# Patient Record
Sex: Male | Born: 1990 | Hispanic: Yes | Marital: Single | Smoking: Current every day smoker
Health system: Southern US, Community
[De-identification: ages and names within clinical notes are randomized; demographics above are authoritative.]

---

## 2019-09-08 ENCOUNTER — Other Ambulatory Visit: Payer: Self-pay

## 2019-09-08 ENCOUNTER — Ambulatory Visit (HOSPITAL_COMMUNITY)
Admission: EM | Admit: 2019-09-08 | Discharge: 2019-09-08 | Disposition: A | Discharge status: Home / Self Care | Payer: Self-pay | Attending: Internal Medicine | Admitting: Internal Medicine

## 2020-06-13 ENCOUNTER — Encounter (HOSPITAL_COMMUNITY): Payer: Self-pay | Admitting: Emergency Medicine

## 2020-06-13 ENCOUNTER — Other Ambulatory Visit: Payer: Self-pay

## 2020-06-13 ENCOUNTER — Emergency Department (HOSPITAL_COMMUNITY)
Admission: EM | Admit: 2020-06-13 | Discharge: 2020-06-13 | Disposition: A | Discharge status: Home / Self Care | Payer: Self-pay | Attending: Emergency Medicine | Admitting: Emergency Medicine

## 2020-06-13 ENCOUNTER — Emergency Department (HOSPITAL_COMMUNITY): Payer: Self-pay

## 2020-06-13 DIAGNOSIS — R609 Edema, unspecified: Secondary | ICD-10-CM

## 2020-06-13 DIAGNOSIS — M7989 Other specified soft tissue disorders: Secondary | ICD-10-CM | POA: Insufficient documentation

## 2020-06-13 DIAGNOSIS — W000XXA Fall on same level due to ice and snow, initial encounter: Secondary | ICD-10-CM | POA: Insufficient documentation

## 2020-06-13 DIAGNOSIS — Z5321 Procedure and treatment not carried out due to patient leaving prior to being seen by health care provider: Secondary | ICD-10-CM | POA: Insufficient documentation

## 2020-06-13 DIAGNOSIS — M79672 Pain in left foot: Secondary | ICD-10-CM | POA: Insufficient documentation
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
## 2020-06-13 NOTE — ED Notes (Signed)
Pt called 3x no answer  

## 2020-12-04 ENCOUNTER — Other Ambulatory Visit: Payer: Self-pay

## 2020-12-04 ENCOUNTER — Ambulatory Visit (HOSPITAL_COMMUNITY)
Admission: EM | Admit: 2020-12-04 | Discharge: 2020-12-04 | Disposition: A | Discharge status: Home / Self Care | Payer: Self-pay | Attending: Emergency Medicine | Admitting: Emergency Medicine

## 2020-12-04 ENCOUNTER — Encounter (HOSPITAL_COMMUNITY): Payer: Self-pay

## 2020-12-04 DIAGNOSIS — R369 Urethral discharge, unspecified: Secondary | ICD-10-CM | POA: Insufficient documentation

## 2020-12-04 DIAGNOSIS — Z113 Encounter for screening for infections with a predominantly sexual mode of transmission: Secondary | ICD-10-CM | POA: Insufficient documentation

## 2020-12-04 NOTE — ED Triage Notes (Signed)
Pt presents with penile discharge for past few days.

## 2020-12-04 NOTE — Discharge Instructions (Addendum)
We will contact you with the results from your lab work and any additional treatment.    Do not have sex while taking undergoing treatment for STI.  Make sure that all of your partners get tested and treated.   Use a condom or other barrier method for all sexual encounters.    Return or go to the Emergency Department if symptoms worsen or do not improve in the next few days.   

## 2020-12-04 NOTE — ED Provider Notes (Signed)
[facility]    CSN: [id number] Arrival date & time: 12/04/20  1204      History   Chief Complaint Chief Complaint  Patient presents with   SEXUALLY TRANSMITTED DISEASE    HPI Vincent Thomas is a 30 y.o. male.   Patient here for evaluation of penile discharge that has been ongoing for the past few days.  Reports similar to when he had an STI in the past.  Denies any known contact with STI positive partners.  Denies any dysuria, urgency, frequency, or flank pain.  Denies any trauma, injury, or other precipitating event.  Denies any specific alleviating or aggravating factors.  Denies any fevers, chest pain, shortness of breath, N/V/D, numbness, tingling, weakness, abdominal pain, or headaches.     The history is provided by the patient.   History reviewed. No pertinent past medical history.  There are no problems to display for this patient.   History reviewed. No pertinent surgical history.     Home Medications    Prior to Admission medications   Not on File    Family History Family History  Family history unknown: Yes    Social History Social History   Tobacco Use   Smoking status: Every Day    Pack years: 0.00  Substance Use Topics   Alcohol use: Yes     Allergies   Patient has no known allergies.   Review of Systems Review of Systems  Genitourinary:  Positive for penile discharge.  All other systems reviewed and are negative.   Physical Exam Triage Vital Signs ED Triage Vitals  Enc Vitals Group     BP 12/04/20 1400 119/67     Pulse Rate 12/04/20 1400 67     Resp 12/04/20 1400 18     Temp 12/04/20 1400 98.2 F (36.8 C)     Temp Source 12/04/20 1400 Oral     SpO2 12/04/20 1400 100 %     Weight --      Height --      Head Circumference --      Peak Flow --      Pain Score 12/04/20 1359 0     Pain Loc --      Pain Edu? --      Excl. in [place]? --    No data found.  Updated Vital Signs BP 119/67 (BP Location: Right Arm)    Pulse 67   Temp 98.2 F (36.8 C) (Oral)   Resp 18   SpO2 100%   Visual Acuity Right Eye Distance:   Left Eye Distance:   Bilateral Distance:    Right Eye Near:   Left Eye Near:    Bilateral Near:     Physical Exam Vitals and nursing note reviewed.  Constitutional:      General: He is not in acute distress.    Appearance: Normal appearance. He is not ill-appearing, toxic-appearing or diaphoretic.  HENT:     Head: Normocephalic and atraumatic.  Eyes:     Conjunctiva/sclera: Conjunctivae normal.  Cardiovascular:     Rate and Rhythm: Normal rate.     Pulses: Normal pulses.  Pulmonary:     Effort: Pulmonary effort is normal.  Abdominal:     General: Abdomen is flat.  Genitourinary:    Comments: declines Musculoskeletal:        General: Normal range of motion.     Cervical back: Normal range of motion.  Skin:    General: Skin is warm and  dry.  Neurological:     General: No focal deficit present.     Mental Status: He is alert and oriented to person, place, and time.  Psychiatric:        Mood and Affect: Mood normal.     UC Treatments / Results  Labs (all labs ordered are listed, but only abnormal results are displayed) Labs Reviewed  CYTOLOGY, (ORAL, ANAL, URETHRAL) ANCILLARY ONLY    EKG   Radiology No results found.  Procedures Procedures (including critical care time)  Medications Ordered in UC Medications - No data to display  Initial Impression / Assessment and Plan / UC Course  I have reviewed the triage vital signs and the nursing notes.  Pertinent labs & imaging results that were available during my care of the patient were reviewed by me and considered in my medical decision making (see chart for details).    Assessment negative for red flags or concerns.  Self swab obtained and will treat based on results.  Discussed that patient needs to abstain from sexual contacts while awaiting results.  Discussed safe sex practices including condom or  other barrier method use.  Follow-up with primary care as needed. Final Clinical Impressions(s) / UC Diagnoses   Final diagnoses:  Penile discharge  Screen for STD (sexually transmitted disease)     Discharge Instructions      We will contact you with the results from your lab work and any additional treatment.    Do not have sex while taking undergoing treatment for STI.  Make sure that all of your partners get tested and treated.   Use a condom or other barrier method for all sexual encounters.    Return or go to the Emergency Department if symptoms worsen or do not improve in the next few days.      ED Prescriptions   None    PDMP not reviewed this encounter.   Ivette Loyal, NP 12/04/20 1439

## 2020-12-05 LAB — CYTOLOGY, (ORAL, ANAL, URETHRAL) ANCILLARY ONLY
Chlamydia: NEGATIVE
Comment: NEGATIVE
Comment: NEGATIVE
Comment: NORMAL
Neisseria Gonorrhea: POSITIVE — AB
Trichomonas: NEGATIVE

## 2020-12-06 ENCOUNTER — Telehealth (HOSPITAL_COMMUNITY): Payer: Self-pay | Admitting: Emergency Medicine

## 2020-12-06 NOTE — Telephone Encounter (Signed)
Per protocol, patient will need treatment with IM Rocephin 500mg for positive Gonorrhea.   Attempted to reach patient x 1, [facility] notified 

## 2020-12-07 ENCOUNTER — Ambulatory Visit (HOSPITAL_COMMUNITY)
Admission: EM | Admit: 2020-12-07 | Discharge: 2020-12-07 | Disposition: A | Discharge status: Home / Self Care | Payer: Self-pay | Attending: Family Medicine | Admitting: Family Medicine

## 2020-12-07 DIAGNOSIS — A549 Gonococcal infection, unspecified: Secondary | ICD-10-CM

## 2020-12-07 MED ORDER — CEFTRIAXONE SODIUM 500 MG IJ SOLR
500.0000 mg | Freq: Once | INTRAMUSCULAR | Status: AC
  Administered 2020-12-07: 500 mg via INTRAMUSCULAR

## 2020-12-07 MED ORDER — CEFTRIAXONE SODIUM 500 MG IJ SOLR
INTRAMUSCULAR | Status: AC
  Filled 2020-12-07: qty 500

## 2020-12-07 MED ORDER — LIDOCAINE HCL (PF) 1 % IJ SOLN
INTRAMUSCULAR | Status: AC
  Filled 2020-12-07: qty 2

## 2020-12-07 NOTE — ED Triage Notes (Signed)
Patient in for treatment for positive Gonorrhea testing.

## 2020-12-07 NOTE — Discharge Instructions (Signed)
Abstain from sexual activity for 7 days. Follow up if symptoms continue.

## 2021-10-08 IMAGING — DX DG FOOT COMPLETE 3+V*L*
3 series · 3 of 3 positions shown · non-contrast
Comparison: None.

CLINICAL DATA: Status post fall.

EXAM:
LEFT FOOT - COMPLETE 3+ VIEW

[foot ap]
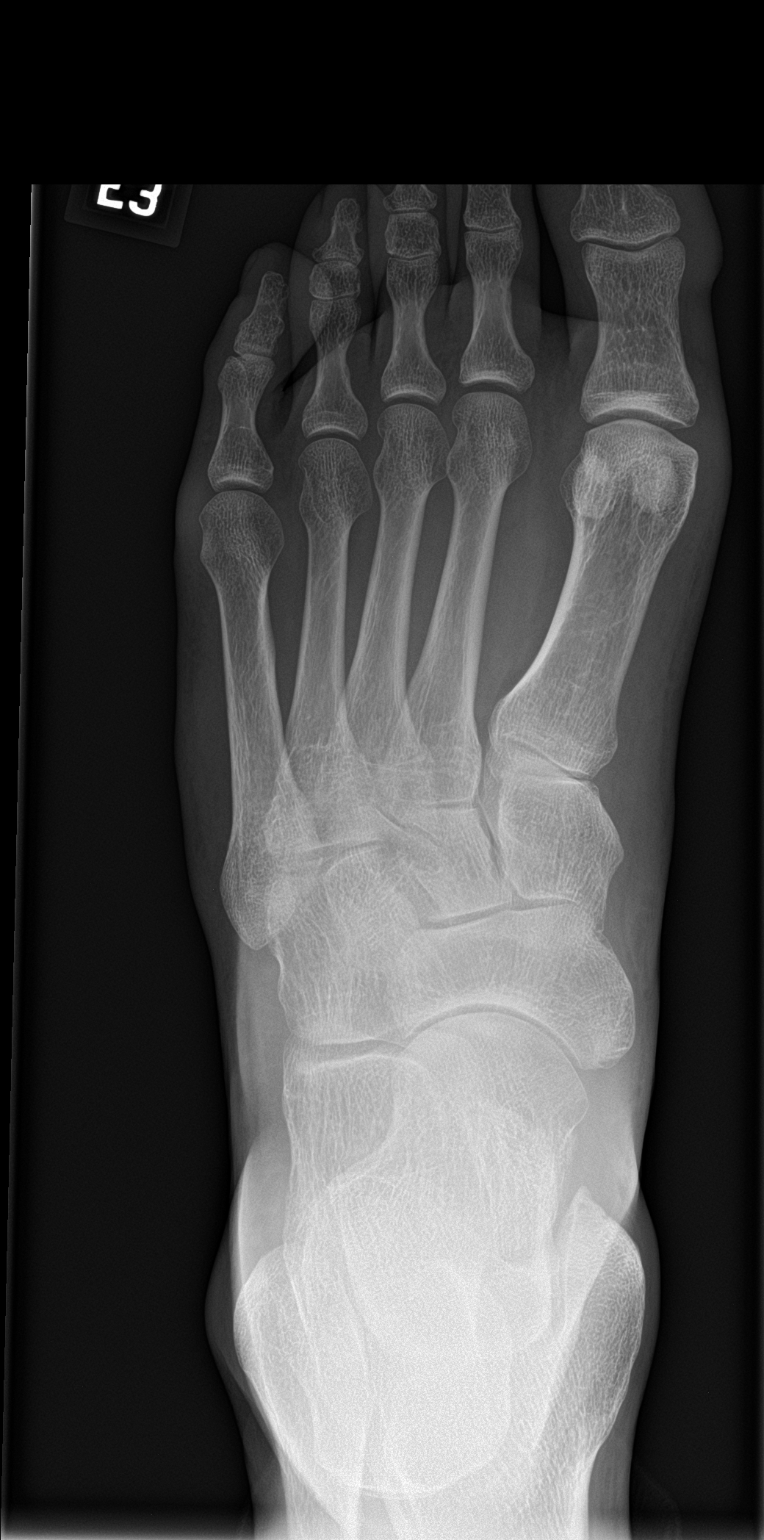

[foot obl]
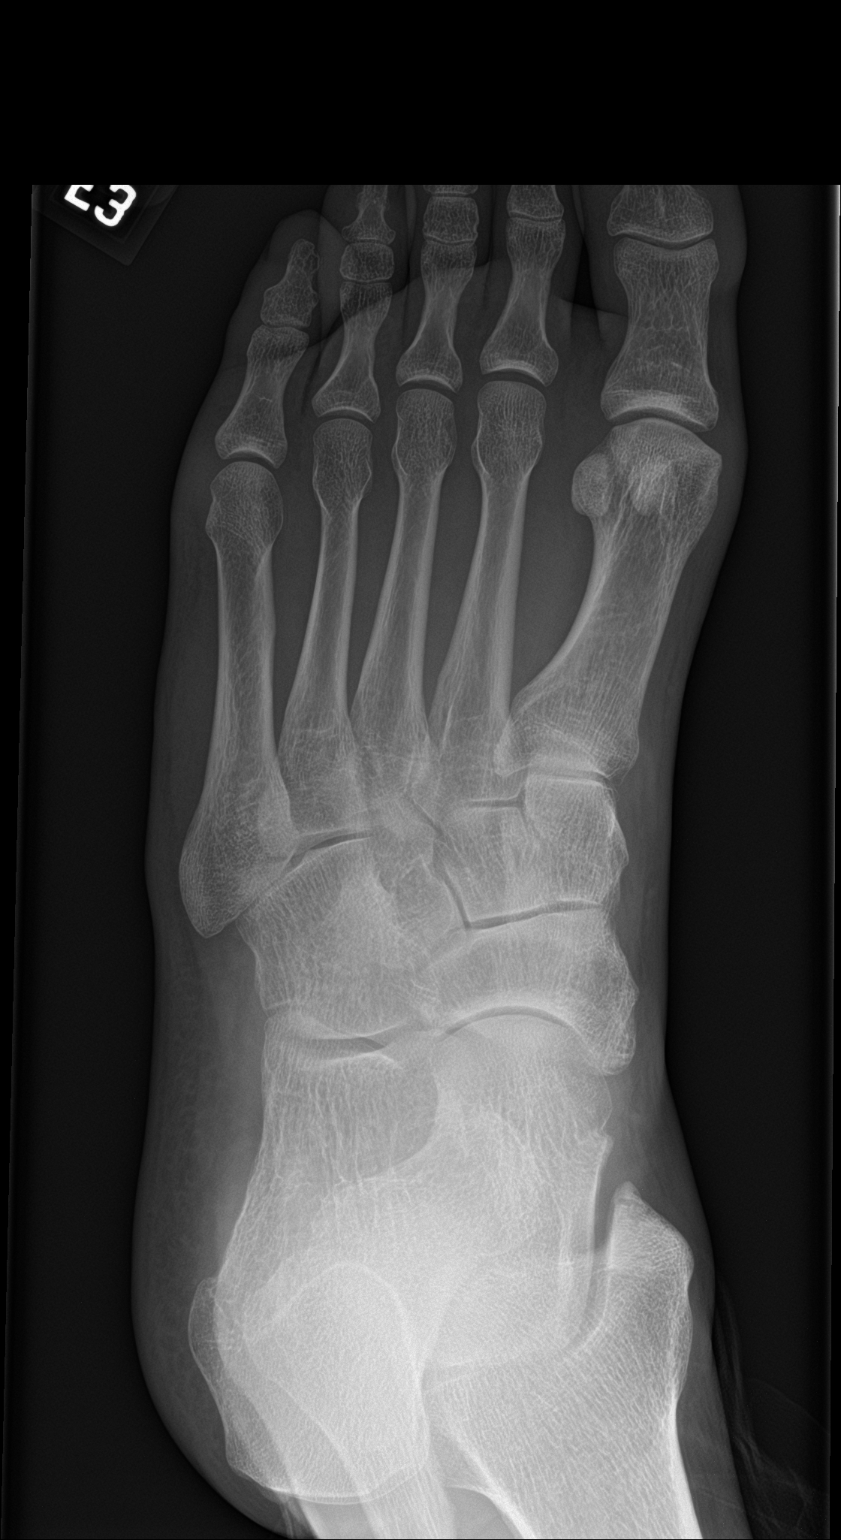

[foot lat]
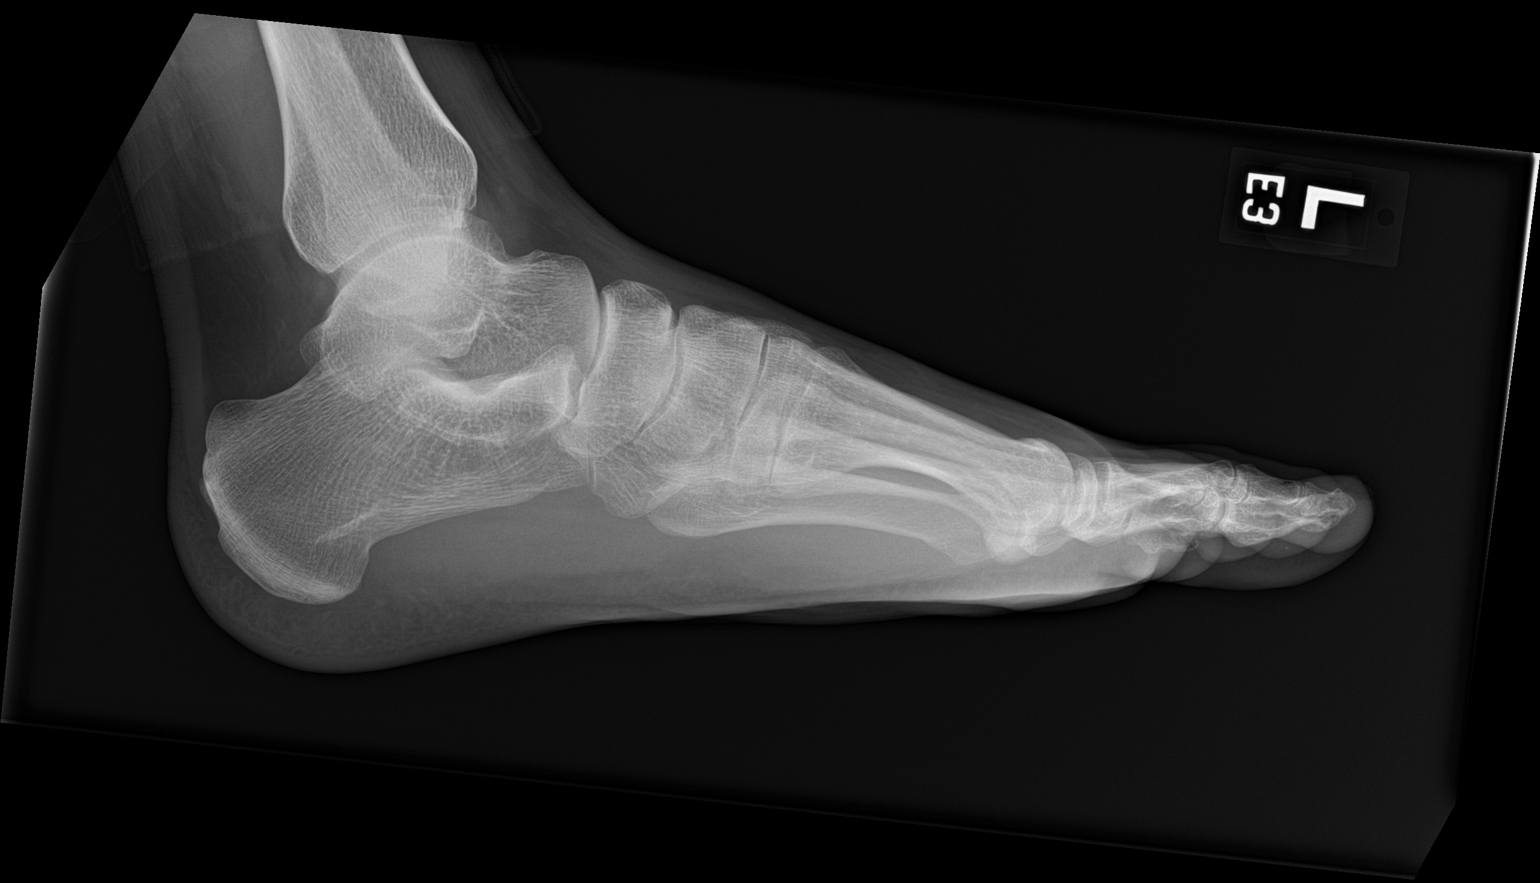

[3 of 3 positions shown; findings below may reference images not displayed]

FINDINGS: There is no evidence of fracture or dislocation. There is no
evidence of arthropathy or other focal bone abnormality. Soft
tissues are unremarkable.
IMPRESSION: Negative.

## 2021-10-17 ENCOUNTER — Encounter (HOSPITAL_COMMUNITY): Payer: Self-pay | Admitting: Emergency Medicine

## 2021-10-17 ENCOUNTER — Ambulatory Visit (HOSPITAL_COMMUNITY)
Admission: EM | Admit: 2021-10-17 | Discharge: 2021-10-17 | Disposition: A | Discharge status: Home / Self Care | Payer: Self-pay | Attending: Emergency Medicine | Admitting: Emergency Medicine

## 2021-10-17 DIAGNOSIS — R369 Urethral discharge, unspecified: Secondary | ICD-10-CM | POA: Insufficient documentation

## 2021-10-17 DIAGNOSIS — Z202 Contact with and (suspected) exposure to infections with a predominantly sexual mode of transmission: Secondary | ICD-10-CM | POA: Insufficient documentation

## 2021-10-17 MED ORDER — DOXYCYCLINE HYCLATE 100 MG PO CAPS: 100.0000 mg | ORAL_CAPSULE | Freq: Two times a day (BID) | ORAL | 0 refills | Status: AC

## 2021-10-17 NOTE — Discharge Instructions (Signed)
Will start treatment for chlamydia due to exposure  Take doxycyline twice a day for 7 days   Labs pending 2-3 days, you will be contacted if positive for any sti and treatment will be sent to the pharmacy, you will have to return to the clinic if positive for gonorrhea to receive treatment   Please refrain from having sex until labs results, if positive please refrain from having sex until treatment complete and symptoms resolve   If positive for  Chlamydia  gonorrhea or trichomoniasis please notify partner or partners so they may tested as well  Moving forward, it is recommended you use some form of protection against the transmission of sti infections  such as condoms or dental dams with each sexual encounter

## 2021-10-17 NOTE — ED Provider Notes (Signed)
[facility]    CSN: [id number] Arrival date & time: 10/17/21  1003      History   Chief Complaint Chief Complaint  Patient presents with   Penile Discharge    HPI Vincent Thomas is a 31 y.o. male.   Patient presents with Vincent Thomas milky discharge, urinary frequency and dysuria for 4 days.  Known exposure to chlamydia.  Sexually active, thin male partners in 3 months, sometimes condom use.  Denies urgency, hematuria, abdominal pain, flank pain, fever, chills, new rash or lesions, penile or testicle swelling.    History reviewed. No pertinent past medical history.  There are no problems to display for this patient.   History reviewed. No pertinent surgical history.     Home Medications    Prior to Admission medications   Not on File    Family History Family History  Family history unknown: Yes    Social History Social History   Tobacco Use   Smoking status: Every Day  Substance Use Topics   Alcohol use: Yes     Allergies   Patient has no known allergies.   Review of Systems Review of Systems  Constitutional: Negative.   Genitourinary:  Positive for dysuria, frequency and penile discharge. Negative for decreased urine volume, difficulty urinating, enuresis, flank pain, genital sores, hematuria, penile pain, penile swelling, scrotal swelling, testicular pain and urgency.  Skin: Negative.     Physical Exam Triage Vital Signs ED Triage Vitals  Enc Vitals Group     BP 10/17/21 1133 118/77     Pulse Rate 10/17/21 1133 67     Resp 10/17/21 1133 18     Temp 10/17/21 1133 97.9 F (36.6 C)     Temp Source 10/17/21 1133 Oral     SpO2 10/17/21 1133 97 %     Weight --      Height --      Head Circumference --      Peak Flow --      Pain Score 10/17/21 1132 0     Pain Loc --      Pain Edu? --      Excl. in [place]? --    No data found.  Updated Vital Signs BP 118/77 (BP Location: Right Arm)   Pulse 67   Temp 97.9 F (36.6 C) (Oral)   Resp  18   SpO2 97%   Visual Acuity Right Eye Distance:   Left Eye Distance:   Bilateral Distance:    Right Eye Near:   Left Eye Near:    Bilateral Near:     Physical Exam Constitutional:      Appearance: Normal appearance.  HENT:     Head: Normocephalic.  Eyes:     Extraocular Movements: Extraocular movements intact.  Pulmonary:     Effort: Pulmonary effort is normal.  Genitourinary:    Comments: deferred Neurological:     Mental Status: He is alert and oriented to person, place, and time. Mental status is at baseline.  Psychiatric:        Mood and Affect: Mood normal.        Behavior: Behavior normal.     UC Treatments / Results  Labs (all labs ordered are listed, but only abnormal results are displayed) Labs Reviewed  CYTOLOGY, (ORAL, ANAL, URETHRAL) ANCILLARY ONLY    EKG   Radiology No results found.  Procedures Procedures (including critical care time)  Medications Ordered in UC Medications - No data to display  Initial  Impression / Assessment and Plan / UC Course  I have reviewed the triage vital signs and the nursing notes.  Pertinent labs & imaging results that were available during my care of the patient were reviewed by me and considered in my medical decision making (see chart for details)  Penile discharge Exposure to chlamydia  As exposure is down and patient is symptomatic, will prophylactically treat with doxycycline 7-day course prescribed, labs are pending, will treat per protocol, advised abstinence until all lab results, treatment is planned and symptoms have resolved, if positive advised patient to notify all 10 recent partners so that they may be tested as well, advised condom use during all sexual encounters with forward, may follow-up with urgent care as Final Clinical Impressions(s) / UC Diagnoses   Final diagnoses:  None   Discharge Instructions   None    ED Prescriptions   None    PDMP not reviewed this encounter.   Hans [place], [place] 10/17/21 1209
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
## 2021-10-21 LAB — CYTOLOGY, (ORAL, ANAL, URETHRAL) ANCILLARY ONLY
Chlamydia: POSITIVE — AB
Comment: NEGATIVE
Comment: NEGATIVE
Comment: NORMAL
Neisseria Gonorrhea: NEGATIVE
Trichomonas: NEGATIVE

## 2022-01-13 ENCOUNTER — Other Ambulatory Visit: Payer: Self-pay

## 2022-01-13 ENCOUNTER — Emergency Department (HOSPITAL_COMMUNITY)
Admission: EM | Admit: 2022-01-13 | Discharge: 2022-01-13 | Disposition: A | Discharge status: Home / Self Care | Payer: Self-pay | Attending: Emergency Medicine | Admitting: Emergency Medicine

## 2022-01-13 ENCOUNTER — Emergency Department (HOSPITAL_COMMUNITY): Payer: Self-pay

## 2022-01-13 DIAGNOSIS — S0000XA Unspecified superficial injury of scalp, initial encounter: Secondary | ICD-10-CM | POA: Diagnosis present

## 2022-01-13 DIAGNOSIS — Y92410 Unspecified street and highway as the place of occurrence of the external cause: Secondary | ICD-10-CM | POA: Insufficient documentation

## 2022-01-13 DIAGNOSIS — S80212A Abrasion, left knee, initial encounter: Secondary | ICD-10-CM | POA: Diagnosis not present

## 2022-01-13 DIAGNOSIS — S60511A Abrasion of right hand, initial encounter: Secondary | ICD-10-CM | POA: Diagnosis not present

## 2022-01-13 DIAGNOSIS — T148XXA Other injury of unspecified body region, initial encounter: Secondary | ICD-10-CM

## 2022-01-13 DIAGNOSIS — S61314A Laceration without foreign body of right ring finger with damage to nail, initial encounter: Secondary | ICD-10-CM

## 2022-01-13 DIAGNOSIS — S0003XA Contusion of scalp, initial encounter: Secondary | ICD-10-CM | POA: Insufficient documentation

## 2022-01-13 DIAGNOSIS — Y900 Blood alcohol level of less than 20 mg/100 ml: Secondary | ICD-10-CM | POA: Diagnosis not present

## 2022-01-13 LAB — I-STAT CHEM 8, ED
BUN: 16 mg/dL (ref 6–20)
Calcium, Ion: 1.12 mmol/L — ABNORMAL LOW (ref 1.15–1.40)
Chloride: 107 mmol/L (ref 98–111)
Creatinine, Ser: 0.8 mg/dL (ref 0.61–1.24)
Glucose, Bld: 109 mg/dL — ABNORMAL HIGH (ref 70–99)
HCT: 48 % (ref 39.0–52.0)
Hemoglobin: 16.3 g/dL (ref 13.0–17.0)
Potassium: 4.2 mmol/L (ref 3.5–5.1)
Sodium: 142 mmol/L (ref 135–145)
TCO2: 21 mmol/L — ABNORMAL LOW (ref 22–32)

## 2022-01-13 LAB — CBC
HCT: 45.6 % (ref 39.0–52.0)
Hemoglobin: 16.3 g/dL (ref 13.0–17.0)
MCH: 29.8 pg (ref 26.0–34.0)
MCHC: 35.7 g/dL (ref 30.0–36.0)
MCV: 83.4 fL (ref 80.0–100.0)
Platelets: 255 10*3/uL (ref 150–400)
RBC: 5.47 MIL/uL (ref 4.22–5.81)
RDW: 15 % (ref 11.5–15.5)
WBC: 11.6 10*3/uL — ABNORMAL HIGH (ref 4.0–10.5)
nRBC: 0 % (ref 0.0–0.2)

## 2022-01-13 LAB — COMPREHENSIVE METABOLIC PANEL
ALT: 16 U/L (ref 0–44)
AST: 16 U/L (ref 15–41)
Albumin: 3.7 g/dL (ref 3.5–5.0)
Alkaline Phosphatase: 44 U/L (ref 38–126)
Anion gap: 6 (ref 5–15)
BUN: 15 mg/dL (ref 6–20)
CO2: 22 mmol/L (ref 22–32)
Calcium: 8.8 mg/dL — ABNORMAL LOW (ref 8.9–10.3)
Chloride: 112 mmol/L — ABNORMAL HIGH (ref 98–111)
Creatinine, Ser: 0.95 mg/dL (ref 0.61–1.24)
GFR, Estimated: >60 mL/min (ref >60)
Glucose, Bld: 113 mg/dL — ABNORMAL HIGH (ref 70–99)
Potassium: 4.2 mmol/L (ref 3.5–5.1)
Sodium: 140 mmol/L (ref 135–145)
Total Bilirubin: 0.4 mg/dL (ref 0.3–1.2)
Total Protein: 5.7 g/dL — ABNORMAL LOW (ref 6.5–8.1)

## 2022-01-13 LAB — PROTIME-INR
INR: 1 (ref 0.8–1.2)
Prothrombin Time: 13 seconds (ref 11.4–15.2)

## 2022-01-13 LAB — SAMPLE TO BLOOD BANK

## 2022-01-13 LAB — ETHANOL: Alcohol, Ethyl (B): <10 mg/dL (ref <10)

## 2022-01-13 LAB — LACTIC ACID, PLASMA: Lactic Acid, Venous: 2.3 mmol/L (ref 0.5–1.9)

## 2022-01-13 MED ORDER — ONDANSETRON HCL 4 MG/2ML IJ SOLN
4.0000 mg | Freq: Once | INTRAMUSCULAR | Status: AC
  Administered 2022-01-13: 4 mg via INTRAVENOUS
  Filled 2022-01-13: qty 2

## 2022-01-13 MED ORDER — TETANUS-DIPHTH-ACELL PERTUSSIS 5-2.5-18.5 LF-MCG/0.5 IM SUSY
0.5000 mL | PREFILLED_SYRINGE | Freq: Once | INTRAMUSCULAR | Status: DC
Start: 2022-01-13 — End: 2022-01-13
  Filled 2022-01-13: qty 0.5

## 2022-01-13 MED ORDER — BACITRACIN ZINC 500 UNIT/GM EX OINT: 1.0000 | TOPICAL_OINTMENT | Freq: Two times a day (BID) | CUTANEOUS | 0 refills | Status: AC

## 2022-01-13 MED ORDER — MORPHINE SULFATE (PF) 4 MG/ML IV SOLN
4.0000 mg | Freq: Once | INTRAVENOUS | Status: AC
  Administered 2022-01-13: 4 mg via INTRAVENOUS
  Filled 2022-01-13: qty 1

## 2022-01-13 MED ORDER — FENTANYL CITRATE PF 50 MCG/ML IJ SOSY
PREFILLED_SYRINGE | INTRAMUSCULAR | Status: AC
  Filled 2022-01-13: qty 1

## 2022-01-13 MED ORDER — CEFAZOLIN SODIUM-DEXTROSE 1-4 GM/50ML-% IV SOLN
1.0000 g | Freq: Once | INTRAVENOUS | Status: AC
  Administered 2022-01-13: 1 g via INTRAVENOUS
  Filled 2022-01-13: qty 50

## 2022-01-13 MED ORDER — BACITRACIN ZINC 500 UNIT/GM EX OINT: TOPICAL_OINTMENT | Freq: Two times a day (BID) | CUTANEOUS | Status: DC

## 2022-01-13 MED ORDER — CEPHALEXIN 500 MG PO CAPS: 500.0000 mg | ORAL_CAPSULE | Freq: Four times a day (QID) | ORAL | 0 refills | Status: AC

## 2022-01-13 MED ORDER — ACETAMINOPHEN 325 MG PO TABS
650.0000 mg | ORAL_TABLET | Freq: Once | ORAL | Status: AC
  Administered 2022-01-13: 650 mg via ORAL
  Filled 2022-01-13: qty 2

## 2022-01-13 MED ORDER — IBUPROFEN 400 MG PO TABS
600.0000 mg | ORAL_TABLET | Freq: Once | ORAL | Status: AC
  Administered 2022-01-13: 600 mg via ORAL
  Filled 2022-01-13: qty 1

## 2022-01-13 MED ORDER — IOHEXOL 300 MG/ML  SOLN
100.0000 mL | Freq: Once | INTRAMUSCULAR | Status: AC | PRN
  Administered 2022-01-13: 100 mL via INTRAVENOUS

## 2022-01-13 MED ORDER — FENTANYL CITRATE PF 50 MCG/ML IJ SOSY
50.0000 ug | PREFILLED_SYRINGE | Freq: Once | INTRAMUSCULAR | Status: AC
  Administered 2022-01-13: 50 ug via INTRAVENOUS

## 2022-01-13 MED ORDER — LIDOCAINE HCL (PF) 1 % IJ SOLN
5.0000 mL | Freq: Once | INTRAMUSCULAR | Status: AC
  Administered 2022-01-13: 5 mL
  Filled 2022-01-13: qty 5
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
## 2022-01-13 NOTE — ED Provider Notes (Signed)
LACERATION REPAIR Performed by: Claude Manges Authorized by: Claude Manges Consent: Verbal consent obtained. Risks and benefits: risks, benefits and alternatives were discussed Consent given by: patient Patient identity confirmed: provided demographic data Prepped and Draped in normal sterile fashion Wound explored  Laceration Location: right hand laceration  Laceration Length: 2 cm in total   No Foreign Bodies seen or palpated  Anesthesia: local infiltration  Local anesthetic: lidocaine 1% without epinephrine  Anesthetic total: 3 ml  Irrigation method: syringe Amount of cleaning: standard  Skin closure: closed and one approximated.   Number of sutures: 5  Technique: simple interrupted  Patient tolerance: Patient tolerated the procedure well with no immediate complications.         Portions of this note were generated with Scientist, clinical (histocompatibility and immunogenetics). Dictation errors may occur despite best attempts at proofreading.     Claude Manges, PA-C 01/13/22 1454    Kneller, [place] K, DO 01/13/22 1559

## 2022-01-13 NOTE — ED Notes (Signed)
Delay to CT for extremity imaging per request of Dr. Dayna Ramus.

## 2022-01-13 NOTE — ED Notes (Signed)
RN and patient reviewed discharge instructions. Patient verbalized understanding and was given the opportunity to ask any questions.

## 2022-01-13 NOTE — ED Notes (Signed)
Patient transported to CT 
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
